# Patient Record
Sex: Male | Born: 1965 | Hispanic: Yes | Marital: Married | Smoking: Never smoker
Health system: Southern US, Community
[De-identification: ages and names within clinical notes are randomized; demographics above are authoritative.]

---

## 2013-11-18 ENCOUNTER — Encounter (HOSPITAL_COMMUNITY): Payer: Self-pay | Admitting: Emergency Medicine

## 2013-11-18 DIAGNOSIS — K4090 Unilateral inguinal hernia, without obstruction or gangrene, not specified as recurrent: Secondary | ICD-10-CM | POA: Insufficient documentation

## 2013-11-18 DIAGNOSIS — R110 Nausea: Secondary | ICD-10-CM | POA: Insufficient documentation

## 2013-11-18 DIAGNOSIS — R109 Unspecified abdominal pain: Secondary | ICD-10-CM | POA: Insufficient documentation

## 2013-11-18 LAB — URINALYSIS, ROUTINE W REFLEX MICROSCOPIC
BILIRUBIN URINE: NEGATIVE
GLUCOSE, UA: NEGATIVE mg/dL
Hgb urine dipstick: NEGATIVE
Ketones, ur: NEGATIVE mg/dL
Leukocytes, UA: NEGATIVE
Nitrite: NEGATIVE
PROTEIN: NEGATIVE mg/dL
Specific Gravity, Urine: 1.018 (ref 1.005–1.030)
UROBILINOGEN UA: 1 mg/dL (ref 0.0–1.0)
pH: 7 (ref 5.0–8.0)

## 2013-11-18 LAB — COMPREHENSIVE METABOLIC PANEL
ALK PHOS: 82 U/L (ref 39–117)
ALT: 31 U/L (ref 0–53)
AST: 24 U/L (ref 0–37)
Albumin: 3.8 g/dL (ref 3.5–5.2)
Anion gap: 11 (ref 5–15)
BUN: 11 mg/dL (ref 6–23)
CO2: 24 mEq/L (ref 19–32)
Calcium: 8.8 mg/dL (ref 8.4–10.5)
Chloride: 103 mEq/L (ref 96–112)
Creatinine, Ser: 0.79 mg/dL (ref 0.50–1.35)
GFR calc Af Amer: >90 mL/min (ref >90)
Glucose, Bld: 93 mg/dL (ref 70–99)
Potassium: 4.2 mEq/L (ref 3.7–5.3)
SODIUM: 138 meq/L (ref 137–147)
Total Bilirubin: 0.2 mg/dL — ABNORMAL LOW (ref 0.3–1.2)
Total Protein: 6.9 g/dL (ref 6.0–8.3)

## 2013-11-18 LAB — CBC WITH DIFFERENTIAL/PLATELET
BASOS ABS: 0 10*3/uL (ref 0.0–0.1)
Basophils Relative: 0 % (ref 0–1)
Eosinophils Absolute: 0.4 10*3/uL (ref 0.0–0.7)
Eosinophils Relative: 5 % (ref 0–5)
HCT: 38.6 % — ABNORMAL LOW (ref 39.0–52.0)
Hemoglobin: 13 g/dL (ref 13.0–17.0)
LYMPHS PCT: 35 % (ref 12–46)
Lymphs Abs: 3.3 10*3/uL (ref 0.7–4.0)
MCH: 29.7 pg (ref 26.0–34.0)
MCHC: 33.7 g/dL (ref 30.0–36.0)
MCV: 88.3 fL (ref 78.0–100.0)
Monocytes Absolute: 0.7 10*3/uL (ref 0.1–1.0)
Monocytes Relative: 7 % (ref 3–12)
NEUTROS PCT: 53 % (ref 43–77)
Neutro Abs: 5 10*3/uL (ref 1.7–7.7)
PLATELETS: 258 10*3/uL (ref 150–400)
RBC: 4.37 MIL/uL (ref 4.22–5.81)
RDW: 12.8 % (ref 11.5–15.5)
WBC: 9.5 10*3/uL (ref 4.0–10.5)

## 2013-11-18 MED ORDER — ONDANSETRON 4 MG PO TBDP
8.0000 mg | ORAL_TABLET | Freq: Once | ORAL | Status: AC
  Administered 2013-11-18: 8 mg via ORAL
  Filled 2013-11-18: qty 2

## 2013-11-18 MED ORDER — FENTANYL CITRATE 0.05 MG/ML IJ SOLN
50.0000 ug | Freq: Once | INTRAMUSCULAR | Status: AC
  Administered 2013-11-18: 50 ug via INTRAVENOUS
  Filled 2013-11-18: qty 2

## 2013-11-18 NOTE — ED Notes (Signed)
Pt reports L flank pain radiating into L groin. Was seen at [facility] yesterday and was told he had kidney stones to R side. Pt concerned because [place] only examined his r side and not L. Pt had CT, ultrasound there. Pt did not fill pain medication prescription. Pain increases when he stands up.

## 2013-11-19 ENCOUNTER — Emergency Department (HOSPITAL_COMMUNITY): Payer: Self-pay

## 2013-11-19 ENCOUNTER — Encounter (HOSPITAL_COMMUNITY): Payer: Self-pay

## 2013-11-19 ENCOUNTER — Emergency Department (HOSPITAL_COMMUNITY)
Admission: EM | Admit: 2013-11-19 | Discharge: 2013-11-19 | Disposition: A | Discharge status: Home / Self Care | Payer: Self-pay | Attending: Emergency Medicine | Admitting: Emergency Medicine

## 2013-11-19 DIAGNOSIS — R109 Unspecified abdominal pain: Secondary | ICD-10-CM

## 2013-11-19 DIAGNOSIS — K4020 Bilateral inguinal hernia, without obstruction or gangrene, not specified as recurrent: Secondary | ICD-10-CM

## 2013-11-19 MED ORDER — MELOXICAM 15 MG PO TABS: 15.0000 mg | ORAL_TABLET | Freq: Every day | ORAL | Status: AC

## 2013-11-19 MED ORDER — IOHEXOL 300 MG/ML  SOLN
25.0000 mL | INTRAMUSCULAR | Status: AC
  Administered 2013-11-19: 25 mL via ORAL

## 2013-11-19 MED ORDER — IOHEXOL 300 MG/ML  SOLN
100.0000 mL | Freq: Once | INTRAMUSCULAR | Status: AC | PRN
  Administered 2013-11-19: 100 mL via INTRAVENOUS

## 2013-11-19 MED ORDER — CYCLOBENZAPRINE HCL 10 MG PO TABS: 10.0000 mg | ORAL_TABLET | Freq: Three times a day (TID) | ORAL | Status: AC | PRN

## 2013-11-19 MED ORDER — HYDROMORPHONE HCL PF 1 MG/ML IJ SOLN
1.0000 mg | Freq: Once | INTRAMUSCULAR | Status: AC
  Administered 2013-11-19: 1 mg via INTRAVENOUS
  Filled 2013-11-19: qty 1

## 2013-11-19 MED ORDER — ONDANSETRON HCL 4 MG/2ML IJ SOLN
4.0000 mg | Freq: Once | INTRAMUSCULAR | Status: AC
  Administered 2013-11-19: 4 mg via INTRAVENOUS
  Filled 2013-11-19: qty 2

## 2013-11-19 NOTE — ED Provider Notes (Signed)
Medical screening examination/treatment/procedure(s) were performed by non-physician practitioner and as supervising physician I was immediately available for consultation/collaboration.   EKG Interpretation None       Olivia Mackielga M Monet North, MD 11/19/13 [phone]

## 2013-11-19 NOTE — ED Notes (Signed)
Ct notified pt finished drinking contrast.  

## 2013-11-19 NOTE — ED Notes (Signed)
Pt ambulating independently w/ steady gait on d/c in no acute distress, A&Ox4. D/c instructions reviewed w/ pt and family - pt and family deny any further questions or concerns at present. Rx given x2  

## 2013-11-19 NOTE — ED Notes (Signed)
Patient transported to CT 

## 2013-11-19 NOTE — ED Provider Notes (Signed)
CSN: [id number]     Arrival date & time 11/18/13  2042 History   First MD Initiated Contact with Patient 11/19/13 0105     Chief Complaint  Patient presents with  . Flank Pain   HPI  History provided by the patient and family. Patient is a 48 year old male with no significant PMH who presents with continued and worsened left flank pain radiating to left groin and testicle. Symptoms have been present for the past few days. They are waxing waning at times with very sharp severe pain. Patient was evaluated at [facility] yesterday in the emergency room. There was question that he may have had a right-sided kidney stone but there was no explanation for his left-sided pain. He also reports having an ultrasound test performed as scrotum and testicles. His symptoms have been associated with slight nausea but denies any vomiting. Pain does seem worse in certain positions including standing. Does improve while resting. Denies any fever, chills or sweats.   History reviewed. No pertinent past medical history. History reviewed. No pertinent past surgical history. No family history on file. History  Substance Use Topics  . Smoking status: Never Smoker   . Smokeless tobacco: Not on file  . Alcohol Use: Yes    Review of Systems  Constitutional: Negative for fever, chills and diaphoresis.  Gastrointestinal: Negative for nausea, vomiting, abdominal pain, diarrhea and constipation.  Genitourinary: Positive for flank pain and testicular pain. Negative for dysuria, frequency, hematuria and discharge.  All other systems reviewed and are negative.     Allergies  Review of patient's allergies indicates no known allergies.  Home Medications   Prior to Admission medications   Not on File   BP 147/82  Pulse 60  Temp(Src) 98.2 F (36.8 C) (Oral)  Resp 18  Ht 5\' 8"  (1.727 m)  Wt 226 lb 12.8 oz (102.876 kg)  BMI 34.49 kg/m2  SpO2 97% Physical Exam  Nursing note and vitals  reviewed. Constitutional: He appears well-developed and well-nourished.  HENT:  Head: Normocephalic.  Cardiovascular: Normal rate and regular rhythm.   Pulmonary/Chest: Effort normal and breath sounds normal.  Abdominal: Soft. There is no tenderness. There is no rebound and no guarding. A hernia is present. Hernia confirmed positive in the left inguinal area.  No CVA tenderness  Genitourinary: Testes normal and penis normal. Uncircumcised.  No mass or tenderness around the testicles. There is tenderness along the superior left spermatic cord area and inguinal canal. There is possible small hernia present but no large hernia appreciated.  Musculoskeletal: Normal range of motion.       Lumbar back: He exhibits tenderness.       Back:  Neurological: He is alert.  Skin: Skin is warm. No rash noted. No erythema.  Psychiatric: He has a normal mood and affect.    ED Course  Procedures   COORDINATION OF CARE:  Nursing notes reviewed. Vital signs reviewed. Initial pt interview and examination performed.   Filed Vitals:   11/18/13 2112  BP: 147/82  Pulse: 60  Temp: 98.2 F (36.8 C)  TempSrc: Oral  Resp: 18  Height: 5\' 8"  (1.727 m)  Weight: 226 lb 12.8 oz (102.876 kg)  SpO2: 97%    1:15 AM-patient seen and evaluated. He is resting sitting still does not appear in severe pain and discomfort. He is afebrile.  Unremarkable lab testing. Patient she is complaining of severe pains. Will obtain CT for further eval.  CT scan without any acute concerning findings. There  are bilateral inguinal hernias containing fat. Nonobstructing right intrarenal stone. No other concerning findings   Treatment plan initiated: Medications  fentaNYL (SUBLIMAZE) injection 50 mcg (50 mcg Intravenous Given 11/18/13 2137)  ondansetron (ZOFRAN-ODT) disintegrating tablet 8 mg (8 mg Oral Given 11/18/13 2137)   Results for orders placed during the hospital encounter of 11/19/13  CBC WITH DIFFERENTIAL      Result  Value Ref Range   WBC 9.5  4.0 - 10.5 K/uL   RBC 4.37  4.22 - 5.81 MIL/uL   Hemoglobin 13.0  13.0 - 17.0 g/dL   HCT 37.1 (*) 69.6 - 78.9 %   MCV 88.3  78.0 - 100.0 fL   MCH 29.7  26.0 - 34.0 pg   MCHC 33.7  30.0 - 36.0 g/dL   RDW 38.1  01.7 - 51.0 %   Platelets 258  150 - 400 K/uL   Neutrophils Relative % 53  43 - 77 %   Neutro Abs 5.0  1.7 - 7.7 K/uL   Lymphocytes Relative 35  12 - 46 %   Lymphs Abs 3.3  0.7 - 4.0 K/uL   Monocytes Relative 7  3 - 12 %   Monocytes Absolute 0.7  0.1 - 1.0 K/uL   Eosinophils Relative 5  0 - 5 %   Eosinophils Absolute 0.4  0.0 - 0.7 K/uL   Basophils Relative 0  0 - 1 %   Basophils Absolute 0.0  0.0 - 0.1 K/uL  COMPREHENSIVE METABOLIC PANEL      Result Value Ref Range   Sodium 138  137 - 147 mEq/L   Potassium 4.2  3.7 - 5.3 mEq/L   Chloride 103  96 - 112 mEq/L   CO2 24  19 - 32 mEq/L   Glucose, Bld 93  70 - 99 mg/dL   BUN 11  6 - 23 mg/dL   Creatinine, Ser 2.58  0.50 - 1.35 mg/dL   Calcium 8.8  8.4 - 52.7 mg/dL   Total Protein 6.9  6.0 - 8.3 g/dL   Albumin 3.8  3.5 - 5.2 g/dL   AST 24  0 - 37 U/L   ALT 31  0 - 53 U/L   Alkaline Phosphatase 82  39 - 117 U/L   Total Bilirubin 0.2 (*) 0.3 - 1.2 mg/dL   GFR calc non Af Amer >90  >90 mL/min   GFR calc Af Amer >90  >90 mL/min   Anion gap 11  5 - 15  URINALYSIS, ROUTINE W REFLEX MICROSCOPIC      Result Value Ref Range   Color, Urine YELLOW  YELLOW   APPearance CLEAR  CLEAR   Specific Gravity, Urine 1.018  1.005 - 1.030   pH 7.0  5.0 - 8.0   Glucose, UA NEGATIVE  NEGATIVE mg/dL   Hgb urine dipstick NEGATIVE  NEGATIVE   Bilirubin Urine NEGATIVE  NEGATIVE   Ketones, ur NEGATIVE  NEGATIVE mg/dL   Protein, ur NEGATIVE  NEGATIVE mg/dL   Urobilinogen, UA 1.0  0.0 - 1.0 mg/dL   Nitrite NEGATIVE  NEGATIVE   Leukocytes, UA NEGATIVE  NEGATIVE    Imaging Review Ct Abdomen Pelvis W Contrast  11/19/2013   CLINICAL DATA:  eval LLQ pain evaluate for diverticulitis, inguinal hernia.  EXAM: CT ABDOMEN AND  PELVIS WITH CONTRAST  TECHNIQUE: Multidetector CT imaging of the abdomen and pelvis was performed using the standard protocol following bolus administration of intravenous contrast.  CONTRAST:  OMNIPAQUE IOHEXOL 300 MG/ML  SOLN  COMPARISON:  None.  FINDINGS: Mild subsegmental atelectasis seen dependently within the visualized lung bases.  Subcentimeter hypodensity within the right hepatic lobe noted (series 2, image 30), too small the characterize by CT, but may represent a small cyst. The liver is otherwise unremarkable. Gallbladder within normal limits. No biliary ductal dilatation. The spleen, adrenal glands, and pancreas demonstrate a normal contrast enhanced appearance.  Kidneys are equal in size with symmetric enhancement. Single 3 mm nonobstructive calculus present within the interpolar right kidney. No other nephrolithiasis. There is no evidence of hydronephrosis. No focal enhancing renal mass.  Stomach within normal limits. No evidence of bowel obstruction. Appendix is well visualized in the right lower quadrant and is of normal caliber and appearance without associated inflammatory changes to suggest acute appendicitis. No significant colonic diverticulosis present. No evidence of for acute diverticulitis. No ventral hernia. Small fat containing bilateral inguinal hernias present, right larger than left.  Bladder within normal limits.  Prostate are unremarkable.  No free air or fluid. No pathologically enlarged intra-abdominal pelvic lymph nodes. Normal intravascular enhancement seen within the abdomen and pelvis.  No acute osseous abnormality. No worrisome lytic or blastic osseous lesions.  IMPRESSION: 1. No CT evidence of acute intra-abdominal or pelvic process identified. 2. No significant colonic diverticulosis identified. No evidence for acute diverticulitis. 3. Small fat containing inguinal hernias bilaterally, right larger than left. 4. Normal appendix. 5. 3 mm nonobstructive right renal  calculus.   Electronically Signed   By: Rise Mu M.D.   On: 11/19/2013 03:36    MDM   Final diagnoses:  Bilateral inguinal hernia without obstruction or gangrene, recurrence not specified  Left flank pain        Angus Seller, PA-C 11/19/13 [phone]

## 2013-11-19 NOTE — Discharge Instructions (Signed)
Please followup with a primary care provider or continued evaluation and treatment of your symptoms. You may also wish to followup with a general surgeon for your inguinal hernias. Return at any time for changing or worsening symptoms.    Hernia inguinal - Adultos  (Inguinal Hernia, Adult)  Los msculos mantienen todos los rganos del cuerpo en Financial controller. Pero si se produce un punto dbil [employer], algunos pueden protruir. Eso se llama hernia. Cuando esto sucede en la parte inferior del vientre (abdomen), se trata de una hernia inguinal. (Toma su nombre de una parte del cuerpo que en esta regin se llamada canal inguinal). Un punto dbil en la pared de los msculos deja que un poco de grasa o parte del intestino delgado salgan hacia afuera. Una hernia inguinal puede desarrollarse a cualquier edad. Los hombres la sufren con ms frecuencia que las mujeres.  CAUSAS  En los adultos, la hernia inguinal desarrolla con el tiempo.   Las causas pueden ser:  Un esfuerzo sbito de los msculos de la parte inferior del abdomen.  Levantar objetos pesados.  Dificultad para mover el intestino. La dificultad para mover el intestino (constipacin) puede llevar a una hernia.  Tos constante. La causa puede ser el tabaquismo o una enfermedad pulmonar.  Tener sobrepeso.  El [place].  Tener un empleo que requiera Location manager perodos de pie o levantar objetos pesados.  Haber sufrido de una hernia inguinal anteriormente. En algunos casos puede convertirse en una situacin de Associate Professor. Cuando esto ocurre, se llama hernia inguinal estrangulada. Se produce cuando una parte del intestino delgado se desliza a travs del punto dbil y no puede volver al abdomen. El flujo de [place] puede interrumpirse. Si esto ocurre, una parte del intestino puede morir. Esta situacin requiere [place] de [place].  SNTOMAS  Generalmente una hernia inguinal pequea no tiene sntomas. Se diagnostica  cuando un profesional de la salud hace un examen fsico. Las hernias ms grandes generalmente presentan sntomas.   En los adultos, los sntomas incluyen:  Un bulto en la ingle. Es fcil de Engineer, manufacturing cuando la persona est de pie. Puede desaparecer al Javier Glazier.  Los hombres pueden tener un bulto Proofreader.  Dolor o ardor en la ingle. Esto ocurre especialmente al levantar objetos, realizar un esfuerzo o toser.  Dolor sordo o sensacin de presin en la ingle.  Los signos de una hernia estrangulada pueden ser:  Neomia Dear protuberancia en la ingle que duele mucho y est sensible al tacto.  Un bulto que se vuelve de color rojo o prpura.  Grant Ruts, nuseas y vmitos.  Imposibilidad de evacuar el intestino o de eliminar gases. DIAGNSTICO  Para diagnosticar una hernia inguinal, el profesional le har un examen fsico.   Incluir preguntas acerca de los sntomas que haya notado.  El mdico palpar el rea de la ingle y le pedir que tosa. Si palpa una hernia inguinal, el mdico podr tratar de deslizarla de nuevo hacia adentro el abdomen.  Por lo general no se necesitan otros estudios. TRATAMIENTO  Los tratamientos IT consultant. Dependern del tamao de la hernia. Las opciones incluyen:   Observacin cuidadosa. Esto a menudo se sugiere si la hernia es pequea y usted no ha tenido sntomas.  No se realizar ningn procedimiento mdico excepto que aparezcan sntomas.  Tendr que prestar atencin a los sntomas. Si tiene sntomas, comunquese con su mdico de inmediato.  Ciruga. Se realiza si la hernia es grande o si tiene sntomas.  Ciruga abierta.  Por lo general, este es un procedimiento ambulatorio (no tendr que pasar la noche en el hospital). Se realiza un corte (incisin) a travs de la piel de la ingle. La hernia se vuelve a colocar en el interior del abdomen. Luego se repara la zona dbil en los msculos con una herniorrafia o hernioplastia. Herniorrafa: en este tipo de  [place], se suturan juntos los msculos dbiles. Hernioplasta: se coloca un parche o malla para cerrar el rea dbil en la pared abdominal.  Laparoscopia. En este procedimiento, el cirujano hace incisiones pequeas. Se coloca en el abdomen un tubo delgado con una pequea cmara de video (llamado laparoscopio). El cirujano repara la hernia con [place], observando en una cmara de vdeo y [address] instrumentos largos. INSTRUCCIONES PARA EL CUIDADO EN EL HOGAR   Despus de la ciruga de reparacin de una hernia inguinal:  Necesitar tomar un analgsico para el dolor recetado por su mdico. Siga cuidadosamente todas las indicaciones.  Tendr que cuidar la herida de la incisin.  Deber restringir algunas actividades por un tiempo. Incluir no levantar objetos pesados   durante varias semanas. Tampoco podr hacer nada demasiado activo durante algunas semanas. La vuelta al Aleen Campi depender del tipo de trabajo que tenga.  Durante perodos de "espera vigilante", usted debe:  Mantenga un peso saludable.  Consumir una dieta rica en fibra (frutas, verduras y granos enteros).  Beba gran cantidad de lquidos para evitar la constipacin. Esto significa beber suficiente agua y otros lquidos para mantener la orina clara o de color amarillo plido.  No levante objetos pesados.  No permanezca de pie durante largos perodos.  Deje de fumar. Evite toser con frecuencia. SOLICITE ATENCIN MDICA SI:   Aparece una protuberancia en el rea de la ingle.  Siente dolor, tiene sensacin de [place] o de presin en la ingle. Esto podra empeorar si levanta pesos o hace esfuerzos.  Tiene fiebre de ms de 100.5 F (38.1 C). SOLICITE ATENCIN MDICA DE INMEDIATO SI:   El dolor en la ingle aumenta repentinamente.  Una protuberancia en la ingle se hace ms grande y no baja.  En los hombres, un dolor repentino en el escroto. O el escroto aumenta de tamao.  Un bulto en el rea de la ingle se vuelve  de color rojo o prpura y es dolorosa al tacto.  Tiene nuseas o vmitos que no desaparecen.  Siente que su corazn late mucho ms rpido de lo normal.  No puede mover el intestino o eliminar gases.  Tiene fiebre de ms de 102.0 F (38.9 C). Document Released: 07/31/2012 [facility] Patient Information 2015 [place], [place]. This information is not intended to replace advice given to you by your health care provider. Make sure you discuss any questions you have with your health care provider.   Dolor de Chief Executive Officer (Dolor sin causa conocida) (Pain of Unknown Etiology [Pain Without a Known Cause]) Usted ha concurrido a Youth worker con un profesional a causa de Herbalist. El dolor puede ocurrir en cualquier parte del cuerpo. Generalmente no tiene [employer]. Si las pruebas de laboratorio (sangre y [place]), las radiografas u otros estudios son normales, el profesional que lo asiste podr tratarlo sin Geologist, engineering causa del Engineer, mining. Un ejemplo es la cefalea. La mayor parte de los dolores de [place] se diagnostican a travs de los antecedentes. Esto significa que el profesional que lo asiste le har preguntas con respecto a la cefalea. El profesional determina un tratamiento basndose en sus respuestas. Generalmente las [employer]  que se realizan debido a las cefaleas son normales. Con frecuencia no se realizan exmenes, a menos que no haya respuesta a los medicamentos. En el da de [place] le administrarn medicamentos para que se sienta mejor, sin considerar la ubicacin del Engineer, miningdolor. Si no puede hallarse una causa fsica para Chief Technology Officerel dolor, as como lleg en forma repentina, en la mayor parte de los Pharmacologistcasos desaparecer gradualmente.  Si siente dolor y no hay motivos para sentirlo, es importante que realice un seguimiento con el profesional que lo asiste. Si el dolor empeora, o no se va, podr ser necesario repetir las pruebas y buscar ms exhaustivamente las posibles causas.  Utilice los medicamentos de  venta libre o de prescripcin para Chief Technology Officerel dolor, Environmental health practitionerel malestar o la Burchardfiebre, segn se lo indique el profesional que lo asiste.  Para proteger su privacidad, no se entregarn los The Sherwin-Williamsresultados de las pruebas por telfono. Asegrese de conseguirlos. Consulte el modo en que podr obtenerlos si no se lo han informado. Es su responsabilidad contar con los Lubrizol Corporationresultados de las pruebas.  Podr seguir con todas las actividades a menos que stas le ocasionen ms Merck & Codolor. Cuando el dolor Argyledisminuya, es importante que reanude gradualmente todas las actividades. Retorne a las actividades o los ejercicios comenzando lentamente e incrementando gradualmente la intensidad y la duracin Durante los perodos de dolor intenso, el reposo en cama puede ser beneficioso. Recustese o sintese en la posicin que le sea ms cmoda.  El hielo es muy eficaz en los episodios agudos (repentinos). Use una bolsa plstica grande llena de hielo y envuelta en una toalla. Esto Engineer, materialsaliviar el dolor.  Consulte al profesional que le asiste si Principal Financialcontinan los problemas. l podr ayudarlo o derivarlo para realizar ejercicios o fisioterapia, si fuera necesario. Si le han administrado medicamentos, no conduzca, no opere maquinarias ni Diplomatic Services operational officerherramientas elctricas, y tampoco firme documentos legales por 24 horas. No beba alcohol, no tome pldoras para dormir ni otros medicamentos que puedan interferir con Scientist, research (medical)el tratamiento. Consulte inmediatamente con el profesionalque lo asiste si siente que el dolor empeora y no se alivia con los medicamentos. Document Released: 01/13/2005 Document Revised: 06/28/2011 [facility] Patient Information 2015 [place]. This information is not intended to replace advice given to you by your health care provider. Make sure you discuss any questions you have with your health care provider.

## 2013-11-21 ENCOUNTER — Encounter: Payer: Self-pay | Admitting: Internal Medicine

## 2013-11-21 ENCOUNTER — Ambulatory Visit: Payer: Self-pay | Attending: Internal Medicine | Admitting: Internal Medicine

## 2013-11-21 VITALS — BP 133/79 | HR 61 | Temp 97.6°F | Resp 16 | Ht 68.0 in | Wt 225.0 lb

## 2013-11-21 DIAGNOSIS — N200 Calculus of kidney: Secondary | ICD-10-CM

## 2013-11-21 DIAGNOSIS — K4020 Bilateral inguinal hernia, without obstruction or gangrene, not specified as recurrent: Secondary | ICD-10-CM

## 2013-11-21 DIAGNOSIS — Z791 Long term (current) use of non-steroidal anti-inflammatories (NSAID): Secondary | ICD-10-CM | POA: Insufficient documentation

## 2013-11-21 MED ORDER — HYDROCODONE-ACETAMINOPHEN 5-325 MG PO TABS: 1.0000 | ORAL_TABLET | Freq: Four times a day (QID) | ORAL | Status: AC | PRN

## 2013-11-21 MED ORDER — KETOROLAC TROMETHAMINE 30 MG/ML IJ SOLN
30.0000 mg | Freq: Once | INTRAMUSCULAR | Status: AC
  Administered 2013-11-21: 30 mg via INTRAMUSCULAR

## 2013-11-21 NOTE — Progress Notes (Signed)
Patient presents to establish care  HFU for bilateral inguinal hernias States pain begins at left flank and wraps around to left groin States pain began 2 months ago but has increased over last week.; rates 8/10 at present

## 2013-11-21 NOTE — Patient Instructions (Signed)
Clculos renales (Kidney Stones) Los clculos renales (urolitiasis) son masas slidas que se forman en el interior de los riones. El dolor intenso es causado por el movimiento de la piedra a travs del tracto urinario. Cuando la piedra se mueve, el urter hace un espasmo alrededor de la misma. El clculo generalmente se elimina con la orina.  CAUSAS   Un trastorno que hace que ciertas glndulas del cuello produzcan demasiada hormona paratiroidea (hiperparatiroidismo primario).  Una acumulacin de cristales de cido rico, similar a la gota en las articulaciones.  Estrechamiento (constriccin) del urter.  Obstruccin en el rin presente al nacer (obstruccin congnita).  Cirugas previas del rin o los urteres.  Numerosas infecciones renales. SNTOMAS   Ganas de vomitar (nuseas).  Devolver la comida (vomitar).  Sangre en la orina (hematuria).  Dolor que generalmente se expande (irradia) hacia la ingle.  Ganas de orinar con frecuencia o de manera urgente. DIAGNSTICO   Historia clnica y examen fsico.  Anlisis de sangre y orina.  Tomografa computada.  En algunos casos se realiza un examen del interior de la vejiga (citoscopa). TRATAMIENTO   Observacin.  Aumentar la ingesta de lquidos.  Litotricia extracorprea con ondas de choque: es un procedimiento no invasivo que utiliza ondas de choque para romper los clculos renales.  Ser necesaria la ciruga si tiene dolor muy intenso o la obstruccin persiste. Hay varios procedimientos quirrgicos. La mayora de los procedimientos se realizan con el uso de pequeos instrumentos. Slo es necesario realizar pequeas incisiones para acomodar estos instrumentos, por lo tanto el tiempo de recuperacin es mnimo. El tamao, la ubicacin y la composicin qumica de los clculos son variables importantes que determinarn la eleccin correcta de tratamiento para su caso. Comunquese con su mdico para comprender mejor su  situacin, de modo que pueda minimizar los riesgos de lesiones para usted y su rin.  INSTRUCCIONES PARA EL CUIDADO EN EL HOGAR   Beba gran cantidad de lquido para mantener la orina de tono claro o color amarillo plido. Esto ayudar a eliminar las piedras o los fragmentos.  Cuele la orina con el colador que le han provisto. Guarde todas las partculas y piedras para que las vea el profesional que lo asiste. Puede ser tan pequea como un grano de sal. Es muy importante usar el colador cada vez que orine. La recoleccin de piedras permitir al mdico analizar y verificar que efectivamente ha eliminado una piedra. El anlisis de la piedra con frecuencia permitir identificar qu puede hacer para reducir la incidencia de las recurrencias.  Slo tome medicamentos de venta libre o recetados para calmar el dolor, el malestar o bajar la fiebre, segn las indicaciones de su mdico.  Cumpla con las citas de seguimiento tal como le indic el profesional que lo asiste.  Si se lo indica, hgase radiografas. La ausencia de dolor no siempre significa que las piedras se han eliminado. Puede ser que simplemente hayan dejado de moverse. Si el paso de orina permanece completamente obstruido, puede causar prdida de la funcin renal o simplemente la destruccin del rin. Es su responsabilidad completar el seguimiento y las radiografas. Las ecografas del rin pueden mostrar una obstruccin y el estado del rin. Las ecografas no se asocian con la radiacin y pueden realizarse fcilmente en cuestin de minutos. SOLICITE ATENCIN MDICA SI:  Siente dolor que no responde a los analgsicos que le recetaron. SOLICITE ATENCIN MDICA DE INMEDIATO SI:   No puede controlar el dolor con los medicamentos que le han recetado.  Siente escalofros   o fiebre.  La gravedad o la intensidad del dolor aumenta durante 18 horas y no se Chief Executive Officer con los analgsicos.  Presenta un nuevo episodio de dolor abdominal.  Sufre mareos  o se desmaya.  No puede orinar. ASEGRESE DE QUE:   Comprende estas instrucciones.  Controlar su afeccin.  Recibir ayuda de inmediato si no mejora o si empeora. Document Released: 04/05/2005 Document Revised: 12/06/2012 [facility] Patient Information 2015 [place], [place]. This information is not intended to replace advice given to you by your health care provider. Make sure you discuss any questions you have with your health care provider.  Hernia inguinal - Adultos  (Inguinal Hernia, Adult)  Los msculos mantienen todos los rganos del cuerpo en Financial controller. Pero si se produce un punto dbil [employer], algunos pueden protruir. Eso se llama hernia. Cuando esto sucede en la parte inferior del vientre (abdomen), se trata de una hernia inguinal. (Toma su nombre de una parte del cuerpo que en esta regin se llamada canal inguinal). Un punto dbil en la pared de los msculos deja que un poco de grasa o parte del intestino delgado salgan hacia afuera. Una hernia inguinal puede desarrollarse a cualquier edad. Los hombres la sufren con ms frecuencia que las mujeres.  CAUSAS  En los adultos, la hernia inguinal desarrolla con el tiempo.   Las causas pueden ser:  Un esfuerzo sbito de los msculos de la parte inferior del abdomen.  Levantar objetos pesados.  Dificultad para mover el intestino. La dificultad para mover el intestino (constipacin) puede llevar a una hernia.  Tos constante. La causa puede ser el tabaquismo o una enfermedad pulmonar.  Tener sobrepeso.  [place].  Tener un empleo que requiera Location manager perodos de pie o levantar objetos pesados.  Haber sufrido de una hernia inguinal anteriormente. En algunos casos puede convertirse en una situacin de Associate Professor. Cuando esto ocurre, se llama hernia inguinal estrangulada. Se produce cuando una parte del intestino delgado se desliza a travs del punto dbil y no puede volver al abdomen. El flujo de  Alpha puede interrumpirse. Si esto ocurre, una parte del intestino puede morir. Esta situacin requiere [place].  SNTOMAS  Generalmente una hernia inguinal pequea no tiene sntomas. Se diagnostica cuando un profesional de la salud hace un examen fsico. Las hernias ms grandes generalmente presentan sntomas.   En los adultos, los sntomas incluyen:  Un bulto en la ingle. Es fcil de Engineer, manufacturing cuando la persona est de pie. Puede desaparecer al Javier Glazier.  Los hombres pueden tener un bulto Proofreader.  Dolor o ardor en la ingle. Esto ocurre especialmente al levantar objetos, realizar un esfuerzo o toser.  Dolor sordo o sensacin de presin en la ingle.  Los signos de una hernia estrangulada pueden ser:  Neomia Dear protuberancia en la ingle que duele mucho y est sensible al tacto.  Un bulto que se vuelve de color rojo o prpura.  Grant Ruts, nuseas y vmitos.  Imposibilidad de evacuar el intestino o de eliminar gases. DIAGNSTICO  Para diagnosticar una hernia inguinal, el profesional le har un examen fsico.   Incluir preguntas acerca de los sntomas que haya notado.  El mdico palpar el rea de la ingle y le pedir que tosa. Si palpa una hernia inguinal, el mdico podr tratar de deslizarla de nuevo hacia adentro el abdomen.  Por lo general no se necesitan otros estudios. TRATAMIENTO  Los tratamientos IT consultant. Dependern del tamao de la hernia. Las opciones incluyen:  Observacin cuidadosa. Esto a menudo se sugiere si la hernia es pequea y usted no ha tenido sntomas.  No se realizar ningn procedimiento mdico excepto que aparezcan sntomas.  Tendr que prestar atencin a los sntomas. Si tiene sntomas, comunquese con su mdico de inmediato.  Ciruga. Se realiza si la hernia es grande o si tiene sntomas.  Ciruga abierta. Por lo general, este es un procedimiento ambulatorio (no tendr que pasar la noche en el hospital). Se realiza un corte  (incisin) a travs de la piel de la ingle. La hernia se vuelve a colocar en el interior del abdomen. Luego se repara la zona dbil en los msculos con una herniorrafia o hernioplastia. Herniorrafa: en este tipo de Azerbaijanciruga, se suturan juntos los msculos dbiles. Hernioplasta: se coloca un parche o malla para cerrar el rea dbil en la pared abdominal.  Laparoscopia. En este procedimiento, el cirujano hace incisiones pequeas. Se coloca en el abdomen un tubo delgado con una pequea cmara de video (llamado laparoscopio). El cirujano repara la hernia con [place] malla, observando en una cmara de vdeo y [address]ando dos instrumentos largos. INSTRUCCIONES PARA EL CUIDADO EN EL HOGAR   Despus de la ciruga de reparacin de una hernia inguinal:  Necesitar tomar un analgsico para el dolor recetado por su mdico. Siga cuidadosamente todas las indicaciones.  Tendr que cuidar la herida de la incisin.  Deber restringir algunas actividades por un tiempo. Incluir no levantar objetos pesados   durante varias semanas. Tampoco podr hacer nada demasiado activo durante algunas semanas. La vuelta al Aleen Campitrabajo depender del tipo de trabajo que tenga.  Durante perodos de "espera vigilante", usted debe:  Mantenga un peso saludable.  Consumir una dieta rica en fibra (frutas, verduras y granos enteros).  Beba gran cantidad de lquidos para evitar la constipacin. Esto significa beber suficiente agua y otros lquidos para mantener la orina clara o de color amarillo plido.  No levante objetos pesados.  No permanezca de pie durante largos perodos.  Deje de fumar. Evite toser con frecuencia. SOLICITE ATENCIN MDICA SI:   Aparece una protuberancia en el rea de la ingle.  Siente dolor, tiene sensacin de [place] Brook[place]emazn o de presin en la ingle. Esto podra empeorar si levanta pesos o hace esfuerzos.  Tiene fiebre de ms de 100.5 F (38.1 C). SOLICITE ATENCIN MDICA DE INMEDIATO SI:   El dolor en la ingle  aumenta repentinamente.  Una protuberancia en la ingle se hace ms grande y no baja.  En los hombres, un dolor repentino en el escroto. O el escroto aumenta de tamao.  Un bulto en el rea de la ingle se vuelve de color rojo o prpura y es dolorosa al tacto.  Tiene nuseas o vmitos que no desaparecen.  Siente que su corazn late mucho ms rpido de lo normal.  No puede mover el intestino o eliminar gases.  Tiene fiebre de ms de 102.0 F (38.9 C). Document Released: 07/31/2012 [facility] Patient Information 2015 [place], [place]. This information is not intended to replace advice given to you by your health care provider. Make sure you discuss any questions you have with your health care provider.

## 2013-11-21 NOTE — Progress Notes (Signed)
Patient ID: Phillip Rivera, male   DOB: 1965/06/03, 48 y.o.   MRN: [id number]  [id number]  [id number]  DOB - 1965/06/03  CC:  Chief Complaint  Patient presents with  . Establish Care  . Hospitalization Follow-up  . Inguinal Hernia       HPI: Phillip Rivera is a 48 y.o. male here today to establish medical care.  Patient reports that [facility]  Patient reports that he was found to have bilateral inguinal hernia's and a small right renal calculi.  Patient reports that he is having severe flank and groin pain on the left.  He states that he is unable to bend or lean to the side due to the severe pain.  Patient states that the pain goes from his left flank down to his left scrotum.     No Known Allergies History reviewed. No pertinent past medical history. Current Outpatient Prescriptions on File Prior to Visit  Medication Sig Dispense Refill  . cyclobenzaprine (FLEXERIL) 10 MG tablet Take 1 tablet (10 mg total) by mouth 3 (three) times daily as needed for muscle spasms.  30 tablet  0  . meloxicam (MOBIC) 15 MG tablet Take 1 tablet (15 mg total) by mouth daily.  30 tablet  0   No current facility-administered medications on file prior to visit.   Family History  Problem Relation Age of Onset  . Heart disease Mother   . Cancer Father [id number]    lung   History   Social History  . Marital Status: Married    Spouse Name: N/A    Number of Children: N/A  . Years of Education: N/A   Occupational History  . Not on file.   Social History Main Topics  . Smoking status: Never Smoker   . Smokeless tobacco: Not on file  . Alcohol Use: Yes  . Drug Use: No  . Sexual Activity: Not on file   Other Topics Concern  . Not on file   Social History Narrative  . No narrative on file   Review of Systems  Gastrointestinal: Positive for nausea. Negative for vomiting.     Objective:   Filed Vitals:   11/21/13 0922  BP: 133/79    Pulse: 61  Temp: 97.6 F (36.4 C)  Resp: 16     Physical Exam  Constitutional: He is oriented to person, place, and time.  Cardiovascular: Normal rate, regular rhythm and normal heart sounds.   Pulmonary/Chest: Effort normal and breath sounds normal.  Abdominal: Soft. Bowel sounds are normal. There is tenderness. A hernia is present. Hernia confirmed positive in the right inguinal area and confirmed positive in the left inguinal area.  Genitourinary: Penis normal. Right testis shows swelling. Left testis shows swelling.  Musculoskeletal: Normal range of motion. He exhibits no edema and no tenderness.  Neurological: He is alert and oriented to person, place, and time.    Lab Results  Component Value Date   WBC 9.5 11/18/2013   HGB 13.0 11/18/2013   HCT 38.6* 11/18/2013   MCV 88.3 11/18/2013   PLT 258 11/18/2013   Lab Results  Component Value Date   CREATININE 0.79 11/18/2013   BUN 11 11/18/2013   NA 138 11/18/2013   K 4.2 11/18/2013   CL 103 11/18/2013   CO2 24 11/18/2013    No results found for this basename: HGBA1C   Lipid Panel  No results found for this basename: chol, trig,  hdl, cholhdl, vldl, ldlcalc       Assessment and plan:   [name] was seen today for establish care, hospitalization follow-up and inguinal hernia.  Diagnoses and associated orders for this visit:  Bilateral inguinal hernia without obstruction or gangrene, recurrence not specified - Ambulatory referral to General Surgery - ketorolac (TORADOL) 30 MG/ML injection 30 mg; Inject 1 mL (30 mg total) into the muscle once. - HYDROcodone-acetaminophen (NORCO/VICODIN) 5-325 MG per tablet; Take 1 tablet by mouth every 6 (six) hours as needed for moderate pain.  Renal calculi    The patient was given clear instructions to go to ER or return to medical center if symptoms don't improve, worsen or new problems develop. The patient verbalized understanding.   Holland Commons, NP-C [facility] [phone] 12/09/2013, 10:52 PM

## 2015-01-18 IMAGING — CT CT ABD-PELV W/ CM
2 of 5 series · 15 of 46 positions shown, 17 images · IV contrast (Omni 300)
Comparison: None.

CLINICAL DATA: eval LLQ pain evaluate for diverticulitis, inguinal
hernia.

EXAM:
CT ABDOMEN AND PELVIS WITH CONTRAST
TECHNIQUE: Multidetector CT imaging of the abdomen and pelvis was performed
using the standard protocol following bolus administration of
intravenous contrast.
CONTRAST:  100mL OMNIPAQUE IOHEXOL 300 MG/ML  SOLN

[Series 2: abd/ pelvis 5.0 i30f 1 · axial · 0.97mm/px · z∈[+682,+1177]mm · 12 of 111 slices shown, 14 images]
[im 6/111  soft-tissue]
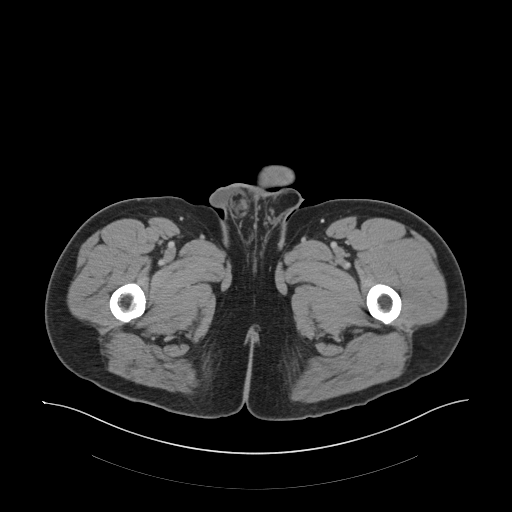
[im 6/111  bone]
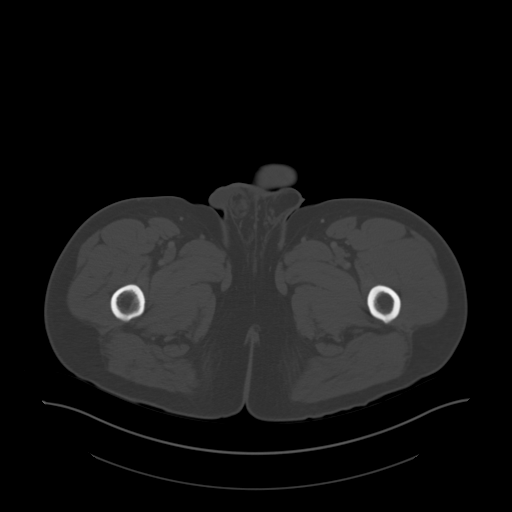
[im 18/111  soft-tissue]
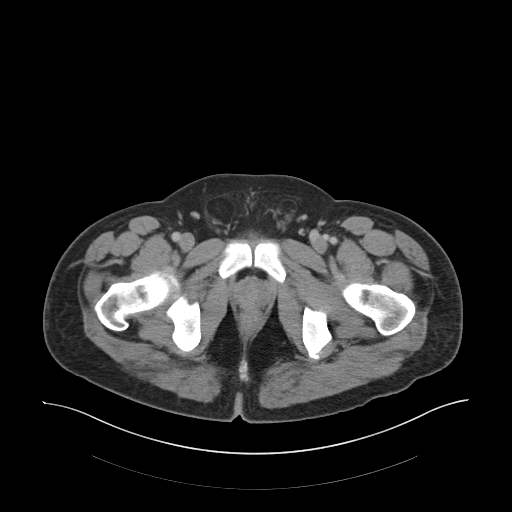
[im 24/111  soft-tissue]
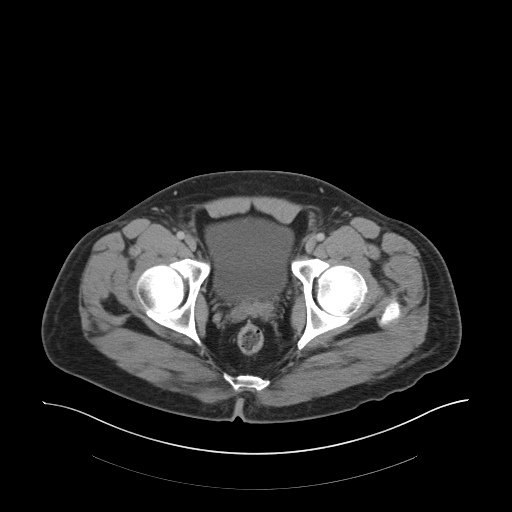
[im 35/111  soft-tissue]
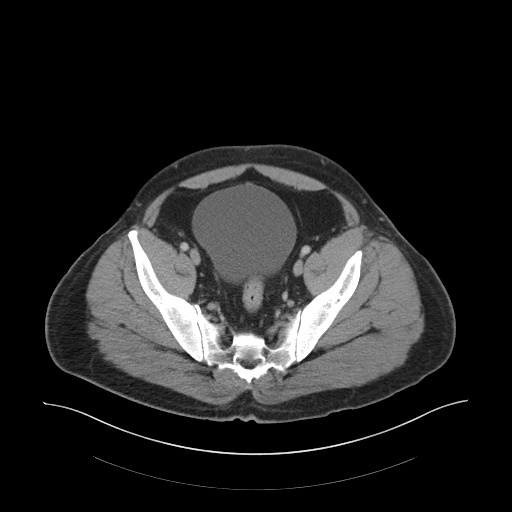
[im 41/111  soft-tissue]
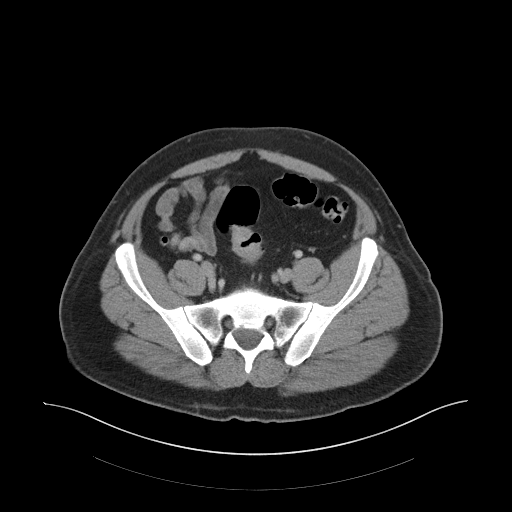
[im 53/111  soft-tissue]
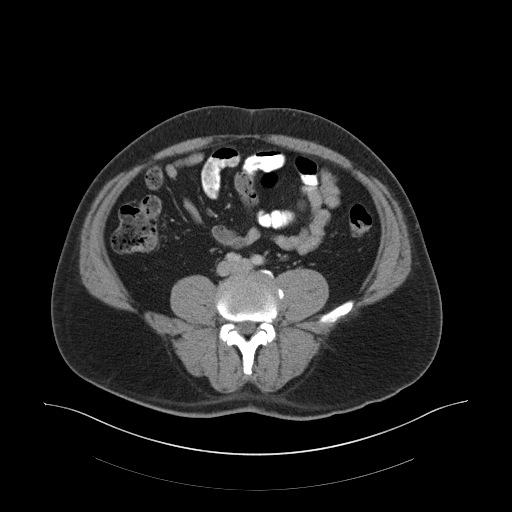
[im 58/111  soft-tissue]
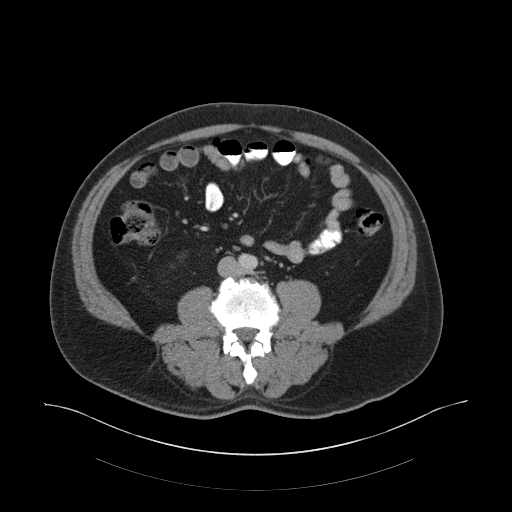
[im 70/111  soft-tissue]
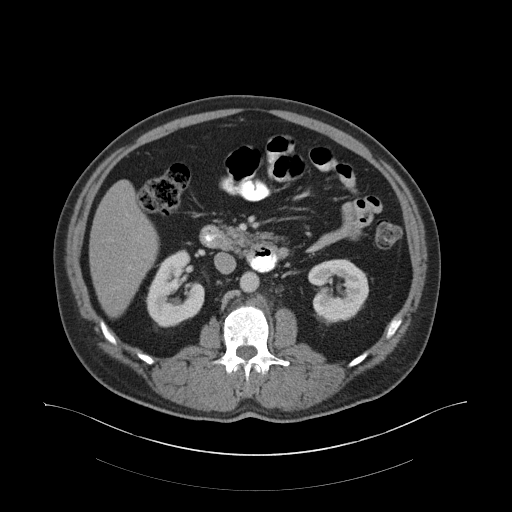
[im 76/111  soft-tissue]
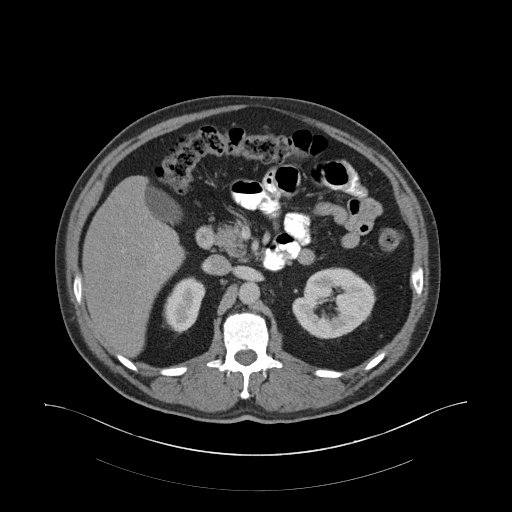
[im 76/111  bone]
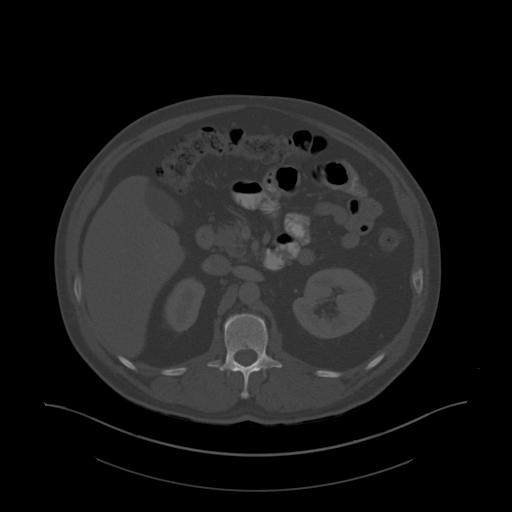
[im 87/111  soft-tissue]
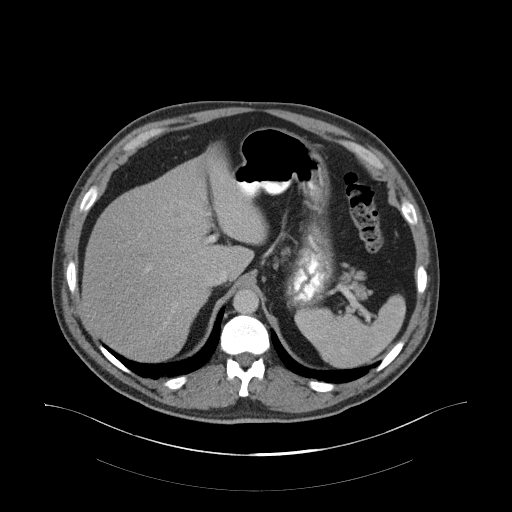
[im 93/111  soft-tissue]
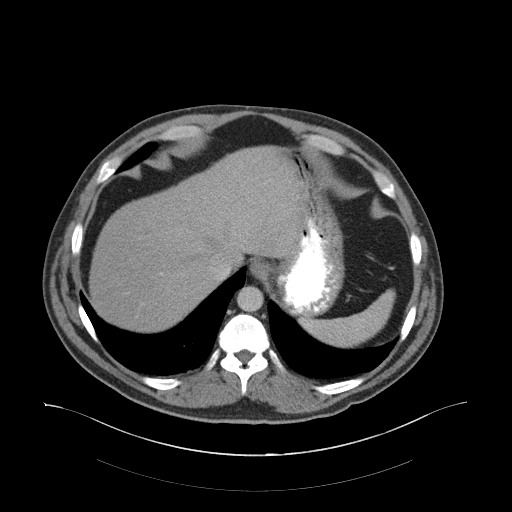
[im 105/111  soft-tissue]
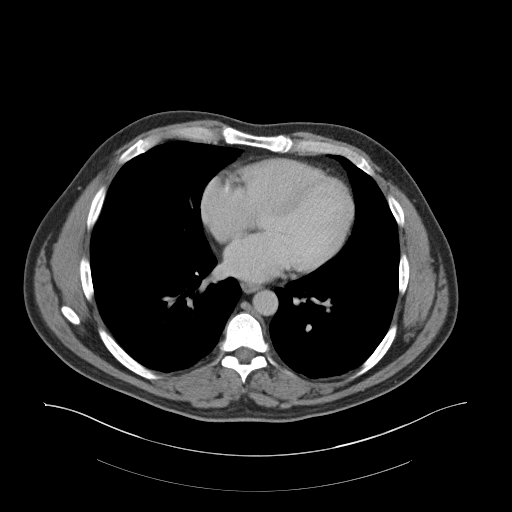

[Series 5: coronals · coronal · 0.76mm/px · 3 of 153 slices shown]
[im 51/153  soft-tissue]
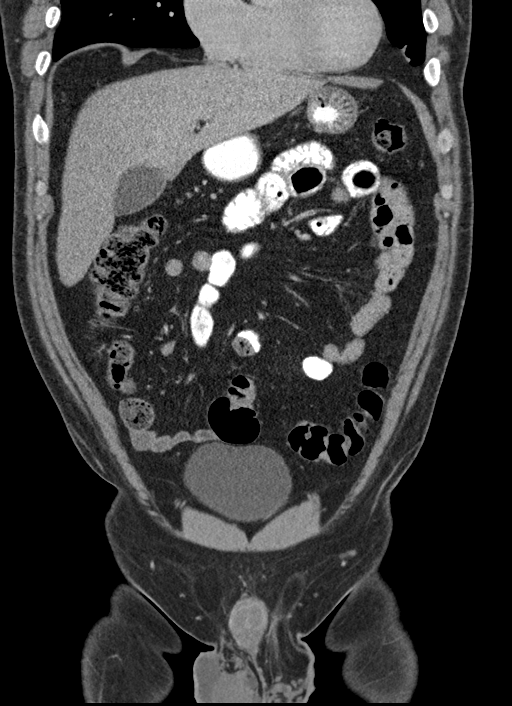
[im 68/153  soft-tissue]
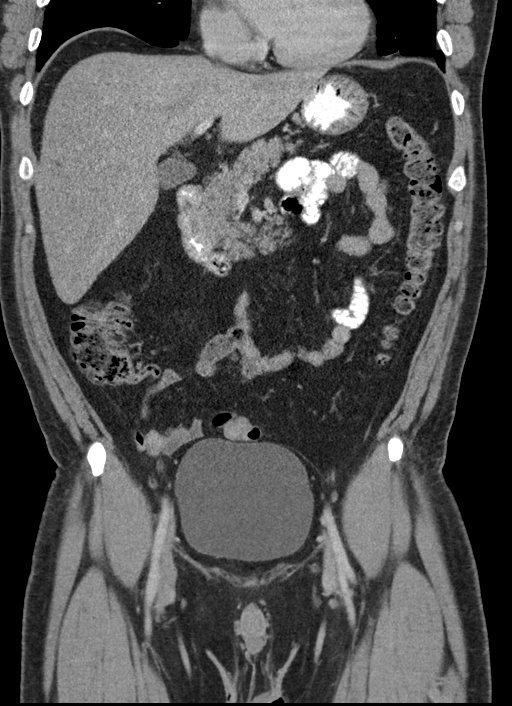
[im 85/153  soft-tissue]
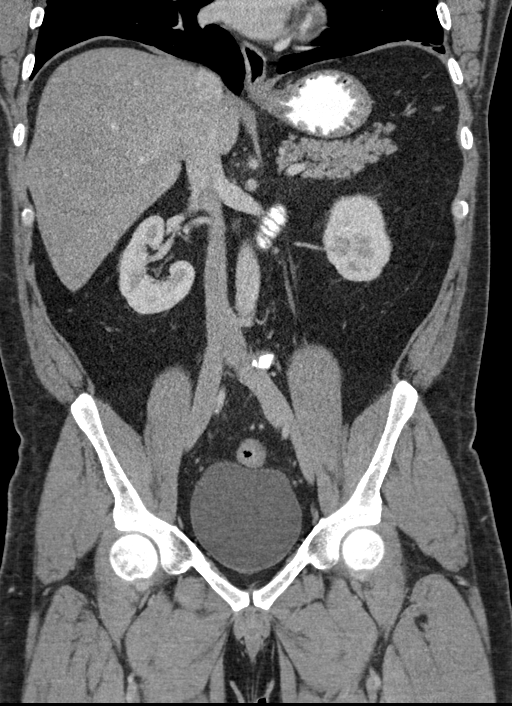

[15 of 46 positions shown; findings below may reference images not displayed]

FINDINGS: Mild subsegmental atelectasis seen dependently within the visualized
lung bases.

Subcentimeter hypodensity within the right hepatic lobe noted
(series 2, image 30), too small the characterize by CT, but may
represent a small cyst. The liver is otherwise unremarkable.
Gallbladder within normal limits. No biliary ductal dilatation. The
spleen, adrenal glands, and pancreas demonstrate a normal contrast
enhanced appearance.

Kidneys are equal in size with symmetric enhancement. Single 3 mm
nonobstructive calculus present within the interpolar right kidney.
No other nephrolithiasis. There is no evidence of hydronephrosis. No
focal enhancing renal mass.

Stomach within normal limits. No evidence of bowel obstruction.
Appendix is well visualized in the right lower quadrant and is of
normal caliber and appearance without associated inflammatory
changes to suggest acute appendicitis. No significant colonic
diverticulosis present. No evidence of for acute diverticulitis. No
ventral hernia. Small fat containing bilateral inguinal hernias
present, right larger than left.

Bladder within normal limits.  Prostate are unremarkable.

No free air or fluid. No pathologically enlarged intra-abdominal
pelvic lymph nodes. Normal intravascular enhancement seen within the
abdomen and pelvis.

No acute osseous abnormality. No worrisome lytic or blastic osseous
lesions.
IMPRESSION: 1. No CT evidence of acute intra-abdominal or pelvic process
identified.
2. No significant colonic diverticulosis identified. No evidence for
acute diverticulitis.
3. Small fat containing inguinal hernias bilaterally, right larger
than left.
4. Normal appendix.
5. 3 mm nonobstructive right renal calculus.
